# Patient Record
Sex: Female | Born: 1980 | Race: White | Hispanic: No | Marital: Single | Smoking: Current every day smoker
Health system: Southern US, Community
[De-identification: ages and names within clinical notes are randomized; demographics above are authoritative.]

## PROBLEM LIST (undated history)

## (undated) DIAGNOSIS — C801 Malignant (primary) neoplasm, unspecified: Secondary | ICD-10-CM

## (undated) DIAGNOSIS — N289 Disorder of kidney and ureter, unspecified: Secondary | ICD-10-CM

## (undated) HISTORY — PX: ABDOMINAL HYSTERECTOMY: SHX81

---

## 2001-06-01 ENCOUNTER — Emergency Department (HOSPITAL_COMMUNITY): Admission: EM | Admit: 2001-06-01 | Discharge: 2001-06-02 | Payer: Self-pay | Admitting: *Deleted

## 2001-06-02 ENCOUNTER — Encounter: Payer: Self-pay | Admitting: Emergency Medicine

## 2014-10-20 ENCOUNTER — Emergency Department (HOSPITAL_COMMUNITY)
Admission: EM | Admit: 2014-10-20 | Discharge: 2014-10-20 | Disposition: A | Discharge status: Home / Self Care | Payer: Self-pay | Attending: Emergency Medicine | Admitting: Emergency Medicine

## 2014-10-20 ENCOUNTER — Encounter (HOSPITAL_COMMUNITY): Payer: Self-pay | Admitting: *Deleted

## 2014-10-20 DIAGNOSIS — Z880 Allergy status to penicillin: Secondary | ICD-10-CM | POA: Insufficient documentation

## 2014-10-20 DIAGNOSIS — L237 Allergic contact dermatitis due to plants, except food: Secondary | ICD-10-CM | POA: Insufficient documentation

## 2014-10-20 DIAGNOSIS — Z720 Tobacco use: Secondary | ICD-10-CM | POA: Insufficient documentation

## 2014-10-20 MED ORDER — PREDNISONE 50 MG PO TABS
60.0000 mg | ORAL_TABLET | Freq: Once | ORAL | Status: AC
  Administered 2014-10-20: 60 mg via ORAL
  Filled 2014-10-20 (×2): qty 1

## 2014-10-20 MED ORDER — FLUORESCEIN SODIUM 1 MG OP STRP
1.0000 | ORAL_STRIP | Freq: Once | OPHTHALMIC | Status: AC
Start: 2014-10-20 — End: 2014-10-20
  Administered 2014-10-20: 1 via OPHTHALMIC

## 2014-10-20 MED ORDER — DIPHENHYDRAMINE HCL 25 MG PO CAPS
50.0000 mg | ORAL_CAPSULE | Freq: Once | ORAL | Status: AC
  Administered 2014-10-20: 50 mg via ORAL

## 2014-10-20 MED ORDER — DIPHENHYDRAMINE HCL 25 MG PO CAPS
ORAL_CAPSULE | ORAL | Status: AC
  Filled 2014-10-20: qty 2

## 2014-10-20 MED ORDER — PREDNISONE 10 MG PO TABS: ORAL_TABLET | ORAL | Status: DC

## 2014-10-20 MED ORDER — FAMOTIDINE 20 MG PO TABS
40.0000 mg | ORAL_TABLET | Freq: Once | ORAL | Status: AC
  Administered 2014-10-20: 40 mg via ORAL
  Filled 2014-10-20: qty 2

## 2014-10-20 MED ORDER — FLUORESCEIN SODIUM 1 MG OP STRP
ORAL_STRIP | OPHTHALMIC | Status: AC
  Administered 2014-10-20: 1 via OPHTHALMIC
  Filled 2014-10-20: qty 1

## 2014-10-20 NOTE — Discharge Instructions (Signed)
°Emergency Department Resource Guide °1) Find a Doctor and Pay Out of Pocket °Although you won't have to find out who is covered by your insurance plan, it is a good idea to ask around and get recommendations. You will then need to call the office and see if the doctor you have chosen will accept you as a new patient and what types of options they offer for patients who are self-pay. Some doctors offer discounts or will set up payment plans for their patients who do not have insurance, but you will need to ask so you aren't surprised when you get to your appointment. ° °2) Contact Your Local Health Department °Not all health departments have doctors that can see patients for sick visits, but many do, so it is worth a call to see if yours does. If you don't know where your local health department is, you can check in your phone book. The CDC also has a tool to help you locate your state's health department, and many state websites also have listings of all of their local health departments. ° °3) Find a Walk-in Clinic °If your illness is not likely to be very severe or complicated, you may want to try a walk in clinic. These are popping up all over the country in pharmacies, drugstores, and shopping centers. They're usually staffed by nurse practitioners or physician assistants that have been trained to treat common illnesses and complaints. They're usually fairly quick and inexpensive. However, if you have serious medical issues or chronic medical problems, these are probably not your best option. ° °No Primary Care Doctor: °- Call [facility] at  832-8000 - they can help you locate a primary care doctor that  accepts your insurance, provides certain services, etc. °- Physician Referral Service- 1-800-533-3463 ° °Chronic Pain Problems: °Organization         Address  Phone   Notes  °[facility]  (336) 297-2271 Patients need to be referred by their primary care doctor.  ° °Medication  Assistance: °Organization         Address  Phone   Notes  °[place] [facility] [address] °[place], [place] [address] (336) 641-8030 --Must be a resident of [place] °-- Must have NO insurance coverage whatsoever (no Medicaid/ Medicare, etc.) °-- The pt. MUST have a primary care doctor that directs their care regularly and follows them in the community °  °[facility]  (866) 331-1348   °[employer]  (888) 892-1162   ° °Agencies that provide inexpensive medical care: °Organization         Address  Phone   Notes  °[facility]  (336) 832-8035   °[facility]    (336) 832-7272   °[facility] [address] °[place], [place] [address] (336) 832-4777   °[facility] [address], °[place] (336) 271-4999   °[facility]    (336) 373-0678   °[facility]    (336) 272-1050   °[facility] ° [address], [place] Phone:  (336) 832-4444, Fax:  (336) 832-4440 Hours of Operation:  9 am - 6 pm, M-F.  Also accepts Medicaid/Medicare and self-pay.  °[facility] ° [address], [place] Phone: (336) 832-3150, Fax: (336) [phone]. Hours of Operation:  8:30 am - 5:30 pm, M-F.  Also accepts Medicaid and self-pay.  °[facility] [place] [address], [place] Phone: (336) 878-6027   °[facility] [address], [place], [place] (336)723-1848, Ext. 123 Mondays & Thursdays: 7-9 AM.  First 15 patients are seen on a first come, first serve basis. °  ° °Medicaid-accepting [place] Providers: ° °Organization         Address  Phone   Notes  °[facility] [address], [place] (336) 641-2100 Also accepts self-pay patients.  °[facility] [address], [place] ° (336) 856-9996   °[facility] [address], [place]  (336) 288-8857   °[facility] [address] [place] [address], [place] (336) 299-7000   °Veita Bland [address], [place]  ° (336) 373-1557 Only accepts  Access Medicaid patients after they have their name applied to their card.  ° °Self-Pay (no insurance) in [place]: ° °Organization         Address  Phone   Notes  °Sickle Cell Patients, [facility] [address], [place] (336) 832-1970   °[facility] [address], [place] (336) 832-4400   °[facility] ° [address], [place] (336) 992-4800   °[facility]/Dr. Osei-Bonsu ° [address] [place] [address], [place] or [address], [place] (336) [phone] Phone number for both [place] and [place] locations is the same.  °[facility] [address], [place] (336) 299-0000   °[facility] [address] [place] [address], [place] or [address] (336) 852-7530 °(336) 878-2260   °[facility] [address], [place] (336) 350-1642, phone; (336) 294-5005, fax Sees patients 1st and 3rd Saturday of every month.  Must not qualify for public or private insurance (i.e. Medicaid, Medicare, [place] Health Choice, Veterans' Benefits) • Household income should be no more than 200% of the poverty level •The clinic cannot treat you if you are pregnant or think you are pregnant • Sexually transmitted diseases are not treated at the clinic.  ° ° °Dental Care: °Organization         Address  Phone  Notes  °[place] [facility] [address], [place] (336) 641-6152 Accepts children up to age 21 who are enrolled in Medicaid or [place] Health Choice; pregnant women with a Medicaid card; and children who have applied for Medicaid or [place] Health Choice, but were declined, whose parents can pay a reduced fee at time of service.  °[place]  [facility] [place]  [address], [place] (336) 641-7733 Accepts children up to age 21 who are enrolled in Medicaid or [place] Health Choice; pregnant women with a Medicaid card; and children who have applied for Medicaid or [place] Health Choice, but were declined, whose parents can pay a reduced fee at time of service.  °[facility] ° [address], [place] (336) 641-4533 Patients are seen by appointment only. Walk-ins are not accepted. [facility] will see patients 18 years of age and older. °Monday - Tuesday (8am-5pm) °Most Wednesdays (8:30-5pm) °$30 per visit, cash only  °[facility] ° [address], [place] (336) 641-4533 Patients are seen by appointment only. Walk-ins are not accepted. [facility] will see patients 18 years of age and older. °One   Wednesday Evening (Monthly: Volunteer Based).  $30 per visit, cash only  °[facility]  (919) 537-3737 for adults; Children under age 4, call Graduate Pediatric Dentistry at ([phone]) [phone]. Children aged 4-14, please call (919) [phone] to request a pediatric application. ° Dental services are provided in all areas of dental care including fillings, crowns and bridges, complete and partial dentures, implants, gum treatment, root canals, and extractions. Preventive care is also provided. Treatment is provided to both adults and children. °Patients are selected via a lottery and there is often a waiting list. °  °[facility] [address], °[place] ° (336) 763-8833 [email or web address] °  °[facility] [address], [place], [place] (336)723-1848, Ext. 123 Second and Fourth Thursday of each month, opens at 6:30 AM; Clinic ends at 9 AM.  Patients are seen on a first-come first-served basis, and a limited number are seen during each clinic.  ° °[facility] ° [address], [place], [place] (336) [phone]    Eligibility Requirements °You must have lived in [place], [place], or [place] counties for at least the last three months. °  You cannot be eligible for state or federal sponsored healthcare insurance, including Veterans Administration, Medicaid, or Medicare. °  You generally cannot be eligible for healthcare insurance through your employer.  °  How to apply: °Eligibility screenings are held every Tuesday and Wednesday afternoon from 1:00 pm until 4:00 pm. You do not need an appointment for the interview!  °[facility] [address], [place], [place] 336-631-2330   °[facility]  336-342-8273   °[facility]  336-703-3100   °[facility]  336-570-6415   ° °Behavioral Health Resources in the Community: °Intensive Outpatient Programs °Organization         Address  Phone  Notes  °[facility] [address], [place], [place] 336-878-6098   °[facility] [address], [place], [place] 336-832-9800   °[facility] [address], [place], [place] ° 336-882-2125   °[facility] [address],  °[place], [place] 1-800-853-5163 or 336-641-4981   °Substance Abuse Resources °Organization         Address  Phone  Notes  °[facility]  336-882-2125   °[facility]  336-784-9470   °[facility]  336-285-9073   °[facility]  336-845-3988   °Residential & Outpatient Substance Abuse Program  1-800-659-3381   °Psychological Services °Organization         Address  Phone  Notes  °[facility]  336- 832-9600   °[facility]  336- 378-7881   °[facility] [address], [place] 1-800-853-5163 or 336-641-4981   ° °Mobile Crisis Teams °Organization         Address  Phone  Notes  °[facility], Mobile Crisis Care Unit  1-877-626-1772   °Assertive °[facility] ° [address]  [place], [place] 336-834-9664   °Sharon DeEsch [address] °[place] 336-554-5454   ° °Self-Help/Support Groups °Organization         Address  Phone             Notes  °[facility] - variety of support groups  336- 373-1402 Call for more information  °Narcotics Anonymous (NA), [facility] [address], °[place]   2 meetings at this location  ° °  Residential Treatment Programs Organization         Address  Phone  Notes  [facility] [address]    [place]  1-878-759-4380   [facility]  [address], [place] [address], [place], [place]   [place], [place] [phone] Admissions: 8am-3pm M-F  [facility] [place] [address],    [place], [place] [phone]   [facility] [address] [place], [place], [place]   [facility] [address]  [place], [place]   [facility] - Intensive Outpatient [address], [place], [place]   [facility] [address] [place]  [place], [place] 1-409 314 2945 or (564)530-0664   [facility] [address], [place], [place] Accepts Medicaid  [facility] [address]  [place] [phone] Substance Abuse/Addiction Treatment   [facility]         Address  Phone  Notes  [facility]  916-572-0181   Domenic Schwab, PhD [address] [place]lis Porta [place], [place]   339-623-5048 or 707-066-8379   [facility] [place], [place] (704)564-8389   [facility] [address], [place], [place] 430-756-8022 Insurance/Medicaid/sponsorship through [facility] [address]                                    [place], [place] (281) [phone] [facility] [address], [place] [phone]    Dr. Adele Schilder  [phone]   [facility] Dept. 1) [address], [place] 2) [place] 3)  [address], [place] [phone]  [phone]   [place] [phone] or [phone] (After Hours)      Take the prescription as directed.  Take over the counter benadryl, as directed on packaging, as needed for itching.  If the benadryl is too sedating, take an over the counter non-sedating antihistamine such as claritin, allegra or zyrtec, as directed on packaging.  Call your regular medical doctor today to schedule a follow up appointment within the next 2 days.  Return to the Emergency Department immediately sooner if worsening.

## 2014-10-20 NOTE — ED Notes (Signed)
MD at bedside. 

## 2014-10-20 NOTE — ED Notes (Addendum)
Pt works for a Adult [employer]. Pt states rash initially noticed to arms yesterday. Woke up this morning with burning to her face and pain to right eye. Pt states right eye was matted shut this morning. Pt states she needs a work note.

## 2014-10-20 NOTE — ED Provider Notes (Signed)
CSN: [id number]     Arrival date & time 10/20/14  3570 History   First MD Initiated Contact with Patient 10/20/14 [phone]     Chief Complaint  Patient presents with  . Allergic Reaction      HPI Pt was seen at [facility]. Per pt, c/o gradual onset and persistence of constant "itching rash" that began yesterday. 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 States today she feels "like my face is burning."  Pt states her right eye "feels like there is a film over it" and it was "matted shut" this morning. Pt has not taken any medication for her symptoms. Denies eyes injury, no change in vision, no FB sensation in eyes, no SOB/wheezing, no dysphagia, no intra-oral edema.    History reviewed. No pertinent past medical history.   Past Surgical History  Procedure Laterality Date  . Cesarean section    . Abdominal hysterectomy      History  Substance Use Topics  . Smoking status: Current Every Day Smoker    Types: Cigarettes  . Smokeless tobacco: Not on file  . Alcohol Use: No    Review of Systems ROS: Statement: All systems negative except as marked or noted in the HPI; Constitutional: Negative for fever and chills. ; ; Eyes: +right eye "matted shut." Negative for eye pain, redness and discharge. ; ; ENMT: Negative for ear pain, hoarseness, nasal congestion, sinus pressure and sore throat. ; ; Cardiovascular: Negative for chest pain, palpitations, diaphoresis, dyspnea and peripheral edema. ; ; Respiratory: Negative for cough, wheezing and stridor. ; ; Gastrointestinal: Negative for nausea, vomiting, diarrhea, abdominal pain, blood in stool, hematemesis, jaundice and rectal bleeding. . ; ; Genitourinary: Negative for dysuria, flank pain and hematuria. ; ; Musculoskeletal: Negative for back pain and neck pain. Negative for swelling and trauma.; ; Skin: +rash, itching. Negative  for abrasions, blisters, bruising and skin lesion.; ; Neuro: Negative for headache, lightheadedness and neck stiffness. Negative for weakness, altered level of consciousness , altered mental status, extremity weakness, paresthesias, involuntary movement, seizure and syncope.      Allergies  Tramadol; Penicillins; and Toradol  Home Medications   Prior to Admission medications   Not on File   BP 110/76 mmHg  Pulse 75  Temp(Src) 97.9 F (36.6 C) (Oral)  Resp 16  Ht 5\' 4"  (1.626 m)  Wt 175 lb (79.379 kg)  BMI 30.02 kg/m2  SpO2 98% Physical Exam  0720: Physical examination:  Nursing notes reviewed; Vital signs and O2 SAT reviewed;  Constitutional: Well developed, Well nourished, Well hydrated, In no acute distress; Head:  Normocephalic, atraumatic. No rash to face.; Eyes: EOMI, PERRL, No scleral icterus; Eye Exam: Right pupil: Size: 3 mm; Findings: Normal, Briskly reactive; Left pupil: Size: 3 mm; Findings: Normal, Briskly reactive; Extraocular movement: Bilateral normal, No nystagmus. ; Eyelid: Bilateral normal, No edema. No discharge, no rash. No ptosis.  Bilat upper and lower eyelids everted for exam, no FB identified. ; Conjunctiva and sclera: Bilateral normal, No conjunctival injection, no chemosis, no discharge.  No obvious hyphema or hypopion.  ; Cornea and anterior chamber: Bilateral normal.  Fluorescein stain right eye: negative for corneal abrasion, no corneal ulcer, no dendrite, neg Seidel's.;; Diagnostic medications: Right fluorescein; Diagnostic instrument: Ophthalmoscope, Wood's lamp;; Visual acuity right: 20/ 25; Visual acuity left: 20/25.;; ENMT: TM's clear bilat. Mouth and pharynx  without lesions. No tonsillar exudates. No intra-oral edema. No submandibular or sublingual edema. No hoarse voice, no drooling, no stridor. No pain with manipulation of larynx. No trismus. Mouth and pharynx normal, Mucous membranes moist; Neck: Supple, Full range of motion, No lymphadenopathy;  Cardiovascular: Regular rate and rhythm, No murmur, rub, or gallop; Respiratory: Breath sounds clear & equal bilaterally, No rales, rhonchi, wheezes.  Speaking full sentences with ease, Normal respiratory effort/excursion; Chest: Nontender, Movement normal; Abdomen: Soft, Nontender, Nondistended, Normal bowel sounds;; Extremities: Pulses normal, No tenderness, No edema, No calf edema or asymmetry.; Neuro: AA&Ox3, Major CN grossly intact.  Speech clear. No gross focal motor or sensory deficits in extremities. Climbs on and off stretcher easily by herself. Gait steady.; Skin: Color normal, Warm, Dry. Pt scratching at bilat forearms scattered linear red rash during exam. No hives.; Psych:  Anxious.    ED Course  Procedures     EKG Interpretation None      MDM  MDM Reviewed: previous chart, nursing note and vitals     0810:  Pt was holding her right eyelid shut during my exam, stating she "feels like it has a film on it" and "wasn't sure if she should open it." Pt instructed to open her eye, as I needed to examine her. Pt then opened her eye and said "oh, it is ok." Right eye irrigated with NS before and after fluorescein stain; no obvious FB, no corneal abrasion, no conjunctival injection or drainage to indictate infection. Pt denies eye pain. Will tx for poison plant exposure with PO meds; f/u PMD. Pt requesting a work note for today. Dx and testing d/w pt.  Questions answered.  Verb understanding, agreeable to d/c home with outpt f/u.      Francine Graven, DO 10/22/14 1453

## 2014-11-16 ENCOUNTER — Emergency Department (HOSPITAL_COMMUNITY)
Admission: EM | Admit: 2014-11-16 | Discharge: 2014-11-17 | Disposition: A | Discharge status: Home / Self Care | Payer: Self-pay | Attending: Emergency Medicine | Admitting: Emergency Medicine

## 2014-11-16 ENCOUNTER — Encounter (HOSPITAL_COMMUNITY): Payer: Self-pay | Admitting: *Deleted

## 2014-11-16 ENCOUNTER — Emergency Department (HOSPITAL_COMMUNITY): Payer: Self-pay

## 2014-11-16 DIAGNOSIS — R319 Hematuria, unspecified: Secondary | ICD-10-CM

## 2014-11-16 DIAGNOSIS — Z880 Allergy status to penicillin: Secondary | ICD-10-CM | POA: Insufficient documentation

## 2014-11-16 DIAGNOSIS — Z87442 Personal history of urinary calculi: Secondary | ICD-10-CM | POA: Insufficient documentation

## 2014-11-16 DIAGNOSIS — R109 Unspecified abdominal pain: Secondary | ICD-10-CM

## 2014-11-16 DIAGNOSIS — N390 Urinary tract infection, site not specified: Secondary | ICD-10-CM | POA: Insufficient documentation

## 2014-11-16 DIAGNOSIS — Z720 Tobacco use: Secondary | ICD-10-CM | POA: Insufficient documentation

## 2014-11-16 DIAGNOSIS — Z3202 Encounter for pregnancy test, result negative: Secondary | ICD-10-CM | POA: Insufficient documentation

## 2014-11-16 LAB — PREGNANCY, URINE: Preg Test, Ur: NEGATIVE

## 2014-11-16 LAB — URINALYSIS, ROUTINE W REFLEX MICROSCOPIC
Glucose, UA: NEGATIVE mg/dL
Ketones, ur: 15 mg/dL — AB
Leukocytes, UA: NEGATIVE
Nitrite: POSITIVE — AB
Protein, ur: 100 mg/dL — AB
Specific Gravity, Urine: >1.03 — ABNORMAL HIGH (ref 1.005–1.030)
UROBILINOGEN UA: 1 mg/dL (ref 0.0–1.0)
pH: 6.5 (ref 5.0–8.0)

## 2014-11-16 LAB — BASIC METABOLIC PANEL
Anion gap: 10 (ref 5–15)
BUN: 21 mg/dL — ABNORMAL HIGH (ref 6–20)
CALCIUM: 9.4 mg/dL (ref 8.9–10.3)
CO2: 24 mmol/L (ref 22–32)
Chloride: 106 mmol/L (ref 101–111)
Creatinine, Ser: 0.83 mg/dL (ref 0.44–1.00)
GFR calc non Af Amer: >60 mL/min (ref >60)
GLUCOSE: 107 mg/dL — AB (ref 65–99)
Potassium: 3.6 mmol/L (ref 3.5–5.1)
Sodium: 140 mmol/L (ref 135–145)

## 2014-11-16 LAB — CBC
HCT: 41.1 % (ref 36.0–46.0)
HEMOGLOBIN: 13.7 g/dL (ref 12.0–15.0)
MCH: 32.5 pg (ref 26.0–34.0)
MCHC: 33.3 g/dL (ref 30.0–36.0)
MCV: 97.4 fL (ref 78.0–100.0)
Platelets: 307 10*3/uL (ref 150–400)
RBC: 4.22 MIL/uL (ref 3.87–5.11)
RDW: 14.3 % (ref 11.5–15.5)
WBC: 8.1 10*3/uL (ref 4.0–10.5)

## 2014-11-16 LAB — URINE MICROSCOPIC-ADD ON

## 2014-11-16 MED ORDER — PROMETHAZINE HCL 25 MG/ML IJ SOLN
25.0000 mg | Freq: Once | INTRAMUSCULAR | Status: AC
  Administered 2014-11-16: 25 mg via INTRAVENOUS
  Filled 2014-11-16: qty 1

## 2014-11-16 MED ORDER — DEXTROSE 5 % IV SOLN
1.0000 g | Freq: Once | INTRAVENOUS | Status: AC
  Administered 2014-11-16: 1 g via INTRAVENOUS
  Filled 2014-11-16: qty 10

## 2014-11-16 MED ORDER — MORPHINE SULFATE 4 MG/ML IJ SOLN
4.0000 mg | Freq: Once | INTRAMUSCULAR | Status: AC
  Administered 2014-11-16: 4 mg via INTRAVENOUS
  Filled 2014-11-16: qty 1

## 2014-11-16 NOTE — ED Notes (Addendum)
Patient c/o left flank pain that radiates through lower back. Patient states she has a history of kidney stones and "this feels like that" per patient.

## 2014-11-16 NOTE — ED Provider Notes (Signed)
TIME SEEN: 10:06 PM   CHIEF COMPLAINT: Left Flank Pain  HPI: HPI Comments: Tammy Snow is a 34 y.o. female who presents to the Emergency Department complaining of left flank pain onset 3 days prior. Pt states she has associated hematuria and dysuria. Pt states she has tried ibuprofen and heating pad with no relief. Pt reports Hx of kidney stones and this pain feels similar to previous kidney stones. Pt states she has a Hx of hysterectomy. No vaginal bleeding or discharge. No fever. No nausea, vomiting or diarrhea.   ROS: See HPI Constitutional: no fever  Eyes: no drainage  ENT: no runny nose   Cardiovascular:  no chest pain  Resp: no SOB  GI: no vomiting GU:  dysuria Integumentary: no rash  Allergy: no hives  Musculoskeletal: no leg swelling  Neurological: no slurred speech ROS otherwise negative  PAST MEDICAL HISTORY/PAST SURGICAL HISTORY:  History reviewed. No pertinent past medical history.  MEDICATIONS:  Prior to Admission medications   Medication Sig Start Date End Date Taking? Authorizing Provider  predniSONE (DELTASONE) 10 MG tablet Take 5 tablets PO x2 days, then take 4 tabs PO x3 days, then 3 tabs PO x3 days, then 2 tabs PO x3 days, then 1 tab PO x3 days 10/20/14   Francine Graven, DO    ALLERGIES:  Allergies  Allergen Reactions  . Tramadol Anaphylaxis    shaking  . Penicillins Swelling  . Toradol [Ketorolac Tromethamine] Swelling    SOCIAL HISTORY:  History  Substance Use Topics  . Smoking status: Current Every Day Smoker    Types: Cigarettes  . Smokeless tobacco: Not on file  . Alcohol Use: No    FAMILY HISTORY: History reviewed. No pertinent family history.  EXAM: BP 117/71 mmHg  Pulse 81  Temp(Src) 97.8 F (36.6 C)  Resp 20  Ht 5\' 4"  (1.626 m)  Wt 176 lb (79.833 kg)  BMI 30.20 kg/m2  SpO2 100% CONSTITUTIONAL: Alert and oriented and responds appropriately to questions. Well-appearing; well-nourished, in no distress HEAD:  Normocephalic EYES: Conjunctivae clear, PERRL ENT: normal nose; no rhinorrhea; moist mucous membranes; pharynx without lesions noted NECK: Supple, no meningismus, no LAD  CARD: RRR; S1 and S2 appreciated; no murmurs, no clicks, no rubs, no gallops RESP: Normal chest excursion without splinting or tachypnea; breath sounds clear and equal bilaterally; no wheezes, no rhonchi, no rales, no hypoxia or respiratory distress, speaking full sentences ABD/GI: Normal bowel sounds; non-distended; soft, non-tender, no rebound, no guarding, no peritoneal signs BACK:  The back appears normal and is non-tender to palpation, there is no CVA tenderness EXT: Normal ROM in all joints; non-tender to palpation; no edema; normal capillary refill; no cyanosis, no calf tenderness or swelling    SKIN: Normal color for age and race; warm NEURO: Moves all extremities equally, sensation to light touch intact diffusely, cranial nerves II through XII intact PSYCH: The patient's mood and manner are appropriate. Grooming and personal hygiene are appropriate.  MEDICAL DECISION MAKING: Patient's labs unremarkable. Urine does show nitrite-positive UTI. Will send urine culture and give ceftriaxone. We'll obtain CT imaging given she has a history of stones and currently has infected urine. Will give pain medicine, antiemetics.  ED PROGRESS: CT scan shows no acute abnormality. No kidney stones, ureterolithiasis or acute intra-abdominal pathology. I feel she is safe to be discharged home. We'll discharge on Keflex and with ibuprofen and Phenergan. I do not feel she needs to be discharged with narcotics. Discussed return precautions and importance of  outpatient follow-up.   I personally performed the services described in this documentation, which was scribed in my presence. The recorded information has been reviewed and is accurate.   [place], DO 11/17/14 0011

## 2014-11-16 NOTE — ED Notes (Signed)
Awaken patient to obtain vitals, patient acknowledged and went straight back to sleep. Patient snoring at this time.

## 2014-11-16 NOTE — ED Notes (Signed)
Pt c/o left sided flank pain that radiates around her lowe back to the right side. Pt c/o nausea and blood in urine.

## 2014-11-17 MED ORDER — PROMETHAZINE HCL 25 MG PO TABS: 25.0000 mg | ORAL_TABLET | Freq: Four times a day (QID) | ORAL | Status: DC | PRN

## 2014-11-17 MED ORDER — CEPHALEXIN 500 MG PO CAPS: 500.0000 mg | ORAL_CAPSULE | Freq: Two times a day (BID) | ORAL | Status: DC

## 2014-11-17 MED ORDER — IBUPROFEN 800 MG PO TABS: 800.0000 mg | ORAL_TABLET | Freq: Three times a day (TID) | ORAL | Status: AC | PRN

## 2014-11-17 NOTE — ED Notes (Signed)
Pt states she got in touch with her cousin to come and pick her up

## 2014-11-17 NOTE — ED Notes (Signed)
Woke patient up to call ride home.

## 2014-11-17 NOTE — ED Notes (Signed)
Woke patient up to call for ride home

## 2014-11-17 NOTE — Discharge Instructions (Signed)

## 2014-11-17 NOTE — ED Notes (Signed)
Patient verbalizes understanding of discharge instructions, prescription medications, home care and follow up care. Patient out of department at this time with family. 

## 2014-11-19 LAB — URINE CULTURE

## 2014-11-21 ENCOUNTER — Telehealth (HOSPITAL_COMMUNITY): Payer: Self-pay

## 2014-11-21 NOTE — Telephone Encounter (Signed)
Post ED Visit - Positive Culture Follow-up  Culture report reviewed by antimicrobial stewardship pharmacist: []  [place], Pharm.D., BCPS []  Heide Guile, Pharm.D., BCPS []  Alycia Rossetti, Pharm.D., BCPS []  [place], [place].D., BCPS, AAHIVP []  Legrand [place], Pharm.D., BCPS, AAHIVP []  Isac Sarna, Pharm.D., BCPS X  Tegan Magsam, Pharm D  Positive Urine culture, >/= 100,000 colonies -> Klebsiella Pneumoniae Treated with Cephalexin, organism sensitive to the same and no further patient follow-up is required at this time.   Dortha Kern 11/21/2014, 4:32 AM

## 2014-11-22 ENCOUNTER — Encounter (HOSPITAL_COMMUNITY): Payer: Self-pay

## 2014-11-22 ENCOUNTER — Emergency Department (HOSPITAL_COMMUNITY)
Admission: EM | Admit: 2014-11-22 | Discharge: 2014-11-22 | Disposition: A | Discharge status: Home / Self Care | Payer: Self-pay | Attending: Emergency Medicine | Admitting: Emergency Medicine

## 2014-11-22 DIAGNOSIS — Z720 Tobacco use: Secondary | ICD-10-CM | POA: Insufficient documentation

## 2014-11-22 DIAGNOSIS — Z7982 Long term (current) use of aspirin: Secondary | ICD-10-CM | POA: Insufficient documentation

## 2014-11-22 DIAGNOSIS — Z792 Long term (current) use of antibiotics: Secondary | ICD-10-CM | POA: Insufficient documentation

## 2014-11-22 DIAGNOSIS — Z880 Allergy status to penicillin: Secondary | ICD-10-CM | POA: Insufficient documentation

## 2014-11-22 DIAGNOSIS — Z8541 Personal history of malignant neoplasm of cervix uteri: Secondary | ICD-10-CM | POA: Insufficient documentation

## 2014-11-22 DIAGNOSIS — N3000 Acute cystitis without hematuria: Secondary | ICD-10-CM | POA: Insufficient documentation

## 2014-11-22 HISTORY — DX: Malignant (primary) neoplasm, unspecified: C80.1

## 2014-11-22 HISTORY — DX: Disorder of kidney and ureter, unspecified: N28.9

## 2014-11-22 LAB — URINE MICROSCOPIC-ADD ON

## 2014-11-22 LAB — URINALYSIS, ROUTINE W REFLEX MICROSCOPIC
GLUCOSE, UA: NEGATIVE mg/dL
Leukocytes, UA: NEGATIVE
NITRITE: NEGATIVE
Specific Gravity, Urine: 1.02 (ref 1.005–1.030)
Urobilinogen, UA: 0.2 mg/dL (ref 0.0–1.0)
pH: 6.5 (ref 5.0–8.0)

## 2014-11-22 MED ORDER — PHENAZOPYRIDINE HCL 200 MG PO TABS
200.0000 mg | ORAL_TABLET | Freq: Three times a day (TID) | ORAL | Status: DC | PRN
Start: 2014-11-22 — End: 2015-04-22

## 2014-11-22 MED ORDER — CEPHALEXIN 500 MG PO CAPS: 500.0000 mg | ORAL_CAPSULE | Freq: Four times a day (QID) | ORAL | Status: DC

## 2014-11-22 NOTE — ED Provider Notes (Signed)
CSN: [id number]     Arrival date & time 11/22/14  1758 History   First MD Initiated Contact with Patient 11/22/14 1804     Chief Complaint  Patient presents with  . Hematuria     (Consider location/radiation/quality/duration/timing/severity/associated sxs/prior Treatment) HPI Comments: Patient is a 34 year old female who presents with complaints of right flank pain that started 2 days ago. She has been diagnosed in the past with kidney stones and tells me that she had lithotripsy in an outside facility in another state. She recently relocated to the area and is now having pain again. She was seen 6 days ago for similar complaints, however documentation reveals that her pain was left-sided. Tonight it is right sided. She had a CT scan performed at that time which revealed no evidence for kidney stone, however did show a urinary tract infection. She tells me that she "lost her prescriptions" and was unable to fill these.  Patient is a 34 y.o. female presenting with hematuria. The history is provided by the patient.  Hematuria This is a recurrent problem. The current episode started 2 days ago. The problem occurs constantly. The problem has been gradually worsening. Associated symptoms include abdominal pain. Pertinent negatives include no chest pain. Nothing aggravates the symptoms. Nothing relieves the symptoms. She has tried nothing for the symptoms. The treatment provided no relief.    Past Medical History  Diagnosis Date  . Renal disorder     kidney stones  . Cancer     cervical cancer   Past Surgical History  Procedure Laterality Date  . Cesarean section    . Abdominal hysterectomy     No family history on file. History  Substance Use Topics  . Smoking status: Current Every Day Smoker    Types: Cigarettes  . Smokeless tobacco: Not on file  . Alcohol Use: No   OB History    No data available     Review of Systems  Cardiovascular: Negative for chest pain.   Gastrointestinal: Positive for abdominal pain.  Genitourinary: Positive for hematuria.  All other systems reviewed and are negative.     Allergies  Tramadol; Penicillins; and Toradol  Home Medications   Prior to Admission medications   Medication Sig Start Date End Date Taking? Authorizing Provider  aspirin EC 325 MG tablet Take 325-650 mg by mouth daily as needed for mild pain or moderate pain.    Historical Provider, MD  cephALEXin (KEFLEX) 500 MG capsule Take 1 capsule (500 mg total) by mouth 2 (two) times daily. 11/17/14   Kristen N Ward, DO  citalopram (CELEXA) 20 MG tablet Take 10 mg by mouth every morning.    Historical Provider, MD  ibuprofen (ADVIL,MOTRIN) 800 MG tablet Take 1 tablet (800 mg total) by mouth every 8 (eight) hours as needed for mild pain. 11/17/14   Kristen N Ward, DO  predniSONE (DELTASONE) 10 MG tablet Take 5 tablets PO x2 days, then take 4 tabs PO x3 days, then 3 tabs PO x3 days, then 2 tabs PO x3 days, then 1 tab PO x3 days Patient not taking: Reported on 11/16/2014 10/20/14   Francine Graven, DO  promethazine (PHENERGAN) 25 MG tablet Take 1 tablet (25 mg total) by mouth every 6 (six) hours as needed for nausea or vomiting. 11/17/14   Kristen N Ward, DO   BP 136/84 mmHg  Pulse 83  Temp(Src) 98.2 F (36.8 C) (Oral)  Resp 19  Ht 5\' 4"  (1.626 m)  Wt 209 lb (94.802  kg)  BMI 35.86 kg/m2  SpO2 97% Physical Exam  Constitutional: She is oriented to person, place, and time. She appears well-developed and well-nourished. No distress.  HENT:  Head: Normocephalic and atraumatic.  Neck: Normal range of motion. Neck supple.  Cardiovascular: Normal rate and regular rhythm.  Exam reveals no gallop and no friction rub.   No murmur heard. Pulmonary/Chest: Effort normal and breath sounds normal. No respiratory distress. She has no wheezes.  Abdominal: Soft. Bowel sounds are normal. She exhibits no distension. There is no tenderness.  There is tenderness to palpation in the  right flank. There is no abdominal tenderness.  Musculoskeletal: Normal range of motion.  Neurological: She is alert and oriented to person, place, and time.  Skin: Skin is warm and dry. She is not diaphoretic.  Nursing note and vitals reviewed.   ED Course  Procedures (including critical care time) Labs Review Labs Reviewed  URINALYSIS, ROUTINE W REFLEX MICROSCOPIC (NOT AT [facility])    Imaging Review No results found.   EKG Interpretation None      MDM   Final diagnoses:  None    Patient presents here with complaints of flank pain. She reports a history of kidney stones prior to moving here from out of state. She had a CT scan here several days ago which revealed no renal calculi or other acute intra-abdominal process. She was to be started on antibiotics, however she tells me that this prescription was lost. Her urine today reveals mainly blood. There are many bacteria, however negative on nitrate and leukocytes. Her urine culture report from her prior visit reveals Klebsiella which is sensitive to cephalosporins. As this went untreated, I will prescribe Keflex, Pyridium, and have the patient follow-up with a primary physician. She is requesting pain medication, however she appears in no significant discomfort and I believe ibuprofen is sufficient.    Veryl Speak, MD 11/22/14 [phone]

## 2014-11-22 NOTE — ED Notes (Signed)
Pt reports was seen here a few days ago for hematuria and was told she may have had a kidney stone.  Pt says was given a prescriptions but says was unable to get them filled.  Pt says now has pain in lower back and still having hematuria.

## 2014-11-22 NOTE — Discharge Instructions (Signed)
Keflex and Pyridium as prescribed.  Ibuprofen 600 mg every 6 hours as needed for pain.  Return to the emergency department if symptoms significantly worsen or change.   Urinary Tract Infection Urinary tract infections (UTIs) can develop anywhere along your urinary tract. Your urinary tract is your body's drainage system for removing wastes and extra water. Your urinary tract includes two kidneys, two ureters, a bladder, and a urethra. Your kidneys are a pair of bean-shaped organs. Each kidney is about the size of your fist. They are located below your ribs, one on each side of your spine. CAUSES Infections are caused by microbes, which are microscopic organisms, including fungi, viruses, and bacteria. These organisms are so small that they can only be seen through a microscope. Bacteria are the microbes that most commonly cause UTIs. SYMPTOMS  Symptoms of UTIs may vary by age and gender of the patient and by the location of the infection. Symptoms in young women typically include a frequent and intense urge to urinate and a painful, burning feeling in the bladder or urethra during urination. Older women and men are more likely to be tired, shaky, and weak and have muscle aches and abdominal pain. A fever may mean the infection is in your kidneys. Other symptoms of a kidney infection include pain in your back or sides below the ribs, nausea, and vomiting. DIAGNOSIS To diagnose a UTI, your caregiver will ask you about your symptoms. Your caregiver also will ask to provide a urine sample. The urine sample will be tested for bacteria and white blood cells. White blood cells are made by your body to help fight infection. TREATMENT  Typically, UTIs can be treated with medication. Because most UTIs are caused by a bacterial infection, they usually can be treated with the use of antibiotics. The choice of antibiotic and length of treatment depend on your symptoms and the type of bacteria causing your  infection. HOME CARE INSTRUCTIONS  If you were prescribed antibiotics, take them exactly as your caregiver instructs you. Finish the medication even if you feel better after you have only taken some of the medication.  Drink enough water and fluids to keep your urine clear or pale yellow.  Avoid caffeine, tea, and carbonated beverages. They tend to irritate your bladder.  Empty your bladder often. Avoid holding urine for long periods of time.  Empty your bladder before and after sexual intercourse.  After a bowel movement, women should cleanse from front to back. Use each tissue only once. SEEK MEDICAL CARE IF:   You have back pain.  You develop a fever.  Your symptoms do not begin to resolve within 3 days. SEEK IMMEDIATE MEDICAL CARE IF:   You have severe back pain or lower abdominal pain.  You develop chills.  You have nausea or vomiting.  You have continued burning or discomfort with urination. MAKE SURE YOU:   Understand these instructions.  Will watch your condition.  Will get help right away if you are not doing well or get worse. Document Released: 03/06/2005 Document Revised: 11/26/2011 Document Reviewed: 07/05/2011 [facility] Patient Information 2015 [place], [place]. This information is not intended to replace advice given to you by your health care provider. Make sure you discuss any questions you have with your health care provider.    Emergency Department Resource Guide 1) Find a Doctor and Pay Out of Pocket Although you won't have to find out who is covered by your insurance plan, it is a good idea to ask  around and get recommendations. You will then need to call the office and see if the doctor you have chosen will accept you as a new patient and what types of options they offer for patients who are self-pay. Some doctors offer discounts or will set up payment plans for their patients who do not have insurance, but you will need to ask so you aren't surprised  when you get to your appointment.  2) Contact Your Local Health Department Not all health departments have doctors that can see patients for sick visits, but many do, so it is worth a call to see if yours does. If you don't know where your local health department is, you can check in your phone book. The CDC also has a tool to help you locate your state's health department, and many state websites also have listings of all of their local health departments.  3) Find a [facility] If your illness is not likely to be very severe or complicated, you may want to try a walk in clinic. These are popping up all over the country in pharmacies, drugstores, and shopping centers. They're usually staffed by nurse practitioners or physician assistants that have been trained to treat common illnesses and complaints. They're usually fairly quick and inexpensive. However, if you have serious medical issues or chronic medical problems, these are probably not your best option.  No Primary Care Doctor: - Call Health Connect at  [phone] - they can help you locate a primary care doctor that  accepts your insurance, provides certain services, etc. - Physician Referral Service- [phone]  Chronic Pain Problems: Organization         Address  Phone   Notes  [facility]  [phone] Patients need to be referred by their primary care doctor.   Medication Assistance: Organization         Address  Phone   Notes  [facility] Medication [facility] [address] [place], [place] [address] [phone] --Must be a resident of [facility] -- Must have NO insurance coverage whatsoever (no Medicaid/ Medicare, etc.) -- The pt. MUST have a primary care doctor that directs their care regularly and follows them in the community   Med[facility]ist  [phone]   [employer]  [phone]    Agencies that provide inexpensive medical care: Organization          Address  Phone   Notes  [place]  [phone]   [facility]    [phone]   [facility] [place], [place] [address] [phone]   [place] [address] [place] [phone]   Planned Parenthood    [phone]   [facility]    [phone]   [place] and [place] [address], [place] Phone:  [phone], Fax:  [phone] Hours of Operation:  9 am - 6 pm, M-F.  Also accepts Medicaid/Medicare and self-pay.  [facility] for [address], [address], [place] Phone: [phone], Fax: [phone]. Hours of Operation:  8:30 am - 5:30 pm, M-F.  Also accepts Medicaid and self-pay.  [facility] [place] [address] [place] Phone: [phone]   [place], [place], [place] [phone], Ext. [phone] Mondays & Thursdays: 7-9 AM.  First 15 patients are seen  on a first come, first serve basis.    [place] Providers:  Organization         Address  Phone   Notes  [facility] [address], [place] [phone] Also accepts self-pay patients.  [facility] [address], [place]  [phone]   [place], [address], [place] [phone]   [facility] [address], [place] [phone]   Lucianne Lei [address], [place]   [phone] Only accepts [place] Access [place] patients after they have their name applied to their card.   Self-Pay (no insurance) in [facility]:  Organization         Address  Phone   Notes  Sickle Cell Patients, [facility] [place] [phone]   [facility]gent Care [place] [phone]    [facility] [place]  [place], [place],  [phone]   [facility]/Dr. Osei-Bonsu  [address] [place] or [address], [place] [phone] Phone number for both [place] and [place] locations is the same.  Urgent Medical and [facility] [address], [place] [phone]   [facility] [address] [place] or [address] [phone]   [facility] [address] [place] [phone], phone; [phone], fax Sees patients 1st and 3rd Saturday of every month.  Must not qualify for public or private insurance (i.e. Medicaid, Medicare, [place] Health Choice, Veterans' Benefits)  Household income should be no more than 200% of the poverty level The clinic cannot treat you if you are pregnant or think you are pregnant  Sexually transmitted diseases are not treated at the clinic.    Dental Care: Organization         Address  Phone  Notes  [facility] [place] [phone] Accepts children up to age 41 who are enrolled in [place] or [place]; pregnant women with a Medicaid card; and children who have applied for Medicaid or [place] Health Choice, but were declined, whose parents can pay a reduced fee at time of service.  [facility] Department of [facility]  [address], [place] [phone] Accepts children up to age 67 who are enrolled in [place] or [place]; pregnant women with a Medicaid card; and children who have applied for Medicaid or [place] Health Choice, but were declined, whose parents can pay a reduced fee at time of service.  [place] Adult [facility] Access PROGRAM  [place] [phone] Patients are seen by appointment only. Walk-ins are not accepted. [place] will see patients 50  years of age and older. Monday - Tuesday (8am-5pm) Most Wednesdays (8:30-5pm) $30 per visit, cash only  [facility] Adult Dental Access PROGRAM  [address], [facility] [phone] Patients are seen by appointment only. Walk-ins are not accepted. [place] will see patients 66 years of age and older. One Wednesday Evening (Monthly: Volunteer Based).  $30 per visit, cash only  [place]  [phone] for adults; Children under age 45, call Graduate Pediatric Dentistry at [phone].  Children aged 51-14, please call [phone] to request a pediatric application.  Dental services are provided in all areas of dental care including fillings, crowns and bridges, complete and partial dentures, implants, gum treatment, root canals, and extractions. Preventive care is also provided. Treatment is provided to both adults and children. Patients are selected via a lottery and there is often a waiting list.   [facility] [address], [place]  [phone] [email or web address]   [facility] [address] [place], [place] [phone], Ext. [phone] Second and Fourth Thursday of each month, opens at 6:30 AM; Clinic ends at 9 AM.  Patients are seen on a first-come first-served basis, and a limited number are seen during each clinic.   [facility]  [address] Hillard Danker [place], [place] [phone]   Eligibility Requirements You must have lived in [place], [place], or [place] counties for at least the last three months.   You cannot be eligible for state or federal sponsored [employer], including [employer], [place], or [employer].   You generally cannot be eligible for healthcare insurance through your employer.    How to apply: Eligibility screenings are held every Tuesday and Wednesday afternoon from 1:00 pm until 4:00 pm. You do not need an appointment for the interview!  [facility]  [address] [place], [place]   [place]  [place] Department  [place]  [phone]    Behavioral Health Resources in the Community: Intensive Outpatient Programs Organization         Address  Phone  Notes  [facility] [place]. [address] [place], [place] [phone]   [facility] Outpatient [address], [place], [place]   ADS: Alcohol & Drug Svcs [address] [place], [place]   [place] [address],  [place], [place] or [phone]   Substance Abuse Resources Organization         Address  Phone  Notes  Alcohol and Drug Services  [phone]   [place]  [phone]   [facility] [place]   Chinita Pester  [phone]   Residential & Outpatient Substance Abuse Program  [phone]   Psychological Services Organization         Address  Phone  Notes  [facility] [place]  [place]  [phone]   [place] [address] [place] or [phone]    Mobile Crisis Teams Organization         Address  Phone  Notes  [facility], Mobile Crisis Care Unit  [phone]   [facility]  [address] [place], [place]   [place] Levels [address] [place] [phone]    Self-Help/Support Groups Organization         Address  Phone             Notes  [place]. of [place] - variety of support groups  [place] Call for more information  Narcotics Anonymous (NA), [facility] [address], [place]  2 meetings at this location   Special educational needs teacher         Address  Phone  Notes  [facility]AP Residential Treatment [place],    [place]   [phone]  [facility]  [address] [place] [address], [place], [place]   [place], [place] [phone] Admissions: 8am-3pm M-F  Incentives Substance [place] [address]    [place], [place] [phone]   [facility] [address] [place], [place], [place]   [facility] [address]  [place], [place]   [facility] Programs - Intensive Outpatient [address] Kristeen [address], [place], [place]   [facility] ([place] [address]  [place], [place] [phone] or [phone]   Residential Treatment Services (RTS) [address], [place], [place] Accepts Medicaid  [facility] [place] [address],  [place] [phone] Substance Abuse/Addiction Treatment   [facility] Organization         Address  Phone  Notes  [facility]  [phone]   Domenic Schwab, PhD [address] Arlis Porta [place], [place]   [phone] or [phone]   [place], [place] [phone]   [facility] [address] [place], [place] [phone] Insurance/Medicaid/sponsorship through [facility] and Families [address] [place] [employer]                                    [place], [place] [phone] [place] [address] [place] [place], [place] [phone]    Dr. Adele Schilder  [phone]   [facility] Dept. 1) [address], [place] 2) [place] 3)  [place] [address], [place] [phone]  [phone]   [place] [phone] or [phone] (After Hours)
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
## 2015-04-22 ENCOUNTER — Emergency Department (HOSPITAL_COMMUNITY)
Admission: EM | Admit: 2015-04-22 | Discharge: 2015-04-23 | Disposition: A | Discharge status: Home / Self Care | Payer: Self-pay | Attending: Emergency Medicine | Admitting: Emergency Medicine

## 2015-04-22 ENCOUNTER — Encounter (HOSPITAL_COMMUNITY): Payer: Self-pay

## 2015-04-22 ENCOUNTER — Emergency Department (HOSPITAL_COMMUNITY): Payer: Self-pay

## 2015-04-22 DIAGNOSIS — S3992XA Unspecified injury of lower back, initial encounter: Secondary | ICD-10-CM | POA: Insufficient documentation

## 2015-04-22 DIAGNOSIS — Y998 Other external cause status: Secondary | ICD-10-CM | POA: Insufficient documentation

## 2015-04-22 DIAGNOSIS — Y9289 Other specified places as the place of occurrence of the external cause: Secondary | ICD-10-CM | POA: Insufficient documentation

## 2015-04-22 DIAGNOSIS — M79641 Pain in right hand: Secondary | ICD-10-CM

## 2015-04-22 DIAGNOSIS — M25572 Pain in left ankle and joints of left foot: Secondary | ICD-10-CM

## 2015-04-22 DIAGNOSIS — S6991XA Unspecified injury of right wrist, hand and finger(s), initial encounter: Secondary | ICD-10-CM | POA: Insufficient documentation

## 2015-04-22 DIAGNOSIS — M25562 Pain in left knee: Secondary | ICD-10-CM

## 2015-04-22 DIAGNOSIS — S8992XA Unspecified injury of left lower leg, initial encounter: Secondary | ICD-10-CM | POA: Insufficient documentation

## 2015-04-22 DIAGNOSIS — Y9389 Activity, other specified: Secondary | ICD-10-CM | POA: Insufficient documentation

## 2015-04-22 DIAGNOSIS — Z79899 Other long term (current) drug therapy: Secondary | ICD-10-CM | POA: Insufficient documentation

## 2015-04-22 DIAGNOSIS — Z7982 Long term (current) use of aspirin: Secondary | ICD-10-CM | POA: Insufficient documentation

## 2015-04-22 DIAGNOSIS — S99912A Unspecified injury of left ankle, initial encounter: Secondary | ICD-10-CM | POA: Insufficient documentation

## 2015-04-22 DIAGNOSIS — F17210 Nicotine dependence, cigarettes, uncomplicated: Secondary | ICD-10-CM | POA: Insufficient documentation

## 2015-04-22 DIAGNOSIS — Z8541 Personal history of malignant neoplasm of cervix uteri: Secondary | ICD-10-CM | POA: Insufficient documentation

## 2015-04-22 DIAGNOSIS — S80212A Abrasion, left knee, initial encounter: Secondary | ICD-10-CM | POA: Insufficient documentation

## 2015-04-22 DIAGNOSIS — Z880 Allergy status to penicillin: Secondary | ICD-10-CM | POA: Insufficient documentation

## 2015-04-22 MED ORDER — ACETAMINOPHEN 325 MG PO TABS
650.0000 mg | ORAL_TABLET | Freq: Once | ORAL | Status: AC
  Administered 2015-04-23: 650 mg via ORAL
  Filled 2015-04-22: qty 2

## 2015-04-22 MED ORDER — HYDROCODONE-ACETAMINOPHEN 5-325 MG PO TABS
1.0000 | ORAL_TABLET | Freq: Once | ORAL | Status: AC
  Administered 2015-04-22: 1 via ORAL
  Filled 2015-04-22: qty 1

## 2015-04-22 MED ORDER — ONDANSETRON 8 MG PO TBDP
8.0000 mg | ORAL_TABLET | Freq: Once | ORAL | Status: AC
  Administered 2015-04-22: 8 mg via ORAL
  Filled 2015-04-22: qty 1
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
## 2015-04-22 NOTE — ED Notes (Signed)
Patient yelling and cursing out into hallway, security called to assist with patient since she refuses to listen to my direction to not yell into ED.
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
## 2015-04-23 NOTE — ED Notes (Signed)
Patient verbalizes understanding of discharge instructions home care and follow up care. Patient out of department at this time. 
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
## 2017-02-08 DEATH — deceased
